# Patient Record
Sex: Female | Born: 1980 | Race: White | Hispanic: No | Marital: Married | Smoking: Never smoker
Health system: Southern US, Community
[De-identification: ages and names within clinical notes are randomized; demographics above are authoritative.]

## PROBLEM LIST (undated history)

## (undated) DIAGNOSIS — R87629 Unspecified abnormal cytological findings in specimens from vagina: Secondary | ICD-10-CM

## (undated) DIAGNOSIS — E039 Hypothyroidism, unspecified: Secondary | ICD-10-CM

## (undated) DIAGNOSIS — K589 Irritable bowel syndrome without diarrhea: Secondary | ICD-10-CM

## (undated) DIAGNOSIS — G479 Sleep disorder, unspecified: Secondary | ICD-10-CM

## (undated) HISTORY — PX: TONSILLECTOMY: SUR1361

## (undated) HISTORY — PX: COLPOSCOPY: SHX161

---

## 2007-05-19 ENCOUNTER — Inpatient Hospital Stay (HOSPITAL_COMMUNITY): Admission: AD | Admit: 2007-05-19 | Discharge: 2007-05-19 | Payer: Self-pay | Admitting: Obstetrics & Gynecology

## 2007-12-05 ENCOUNTER — Ambulatory Visit: Payer: Self-pay | Admitting: Gynecology

## 2007-12-05 ENCOUNTER — Inpatient Hospital Stay (HOSPITAL_COMMUNITY): Admission: AD | Admit: 2007-12-05 | Discharge: 2007-12-09 | Payer: Self-pay | Admitting: Obstetrics and Gynecology

## 2007-12-06 ENCOUNTER — Encounter (INDEPENDENT_AMBULATORY_CARE_PROVIDER_SITE_OTHER): Payer: Self-pay | Admitting: Obstetrics

## 2007-12-11 ENCOUNTER — Encounter (HOSPITAL_COMMUNITY): Admission: RE | Admit: 2007-12-11 | Discharge: 2008-01-10 | Payer: Self-pay | Admitting: Obstetrics & Gynecology

## 2009-10-16 ENCOUNTER — Inpatient Hospital Stay (HOSPITAL_COMMUNITY): Admission: AD | Admit: 2009-10-16 | Discharge: 2009-10-16 | Payer: Self-pay | Admitting: Obstetrics

## 2009-10-27 ENCOUNTER — Inpatient Hospital Stay (HOSPITAL_COMMUNITY): Admission: AD | Admit: 2009-10-27 | Discharge: 2009-10-29 | Payer: Self-pay | Admitting: Obstetrics & Gynecology

## 2009-11-03 ENCOUNTER — Encounter: Admission: RE | Admit: 2009-11-03 | Discharge: 2009-11-25 | Payer: Self-pay | Admitting: Obstetrics

## 2010-05-18 ENCOUNTER — Encounter: Admission: RE | Admit: 2010-05-18 | Discharge: 2010-05-18 | Payer: Self-pay | Admitting: Orthopedic Surgery

## 2011-03-17 LAB — CBC
HCT: 36.8 % (ref 36.0–46.0)
Hemoglobin: 9.7 g/dL — ABNORMAL LOW (ref 12.0–15.0)
Hemoglobin: 12.4 g/dL (ref 12.0–15.0)
MCHC: 33.6 g/dL (ref 30.0–36.0)
MCV: 86.1 fL (ref 78.0–100.0)
MCV: 86.1 fL (ref 78.0–100.0)
Platelets: 213 10*3/uL (ref 150–400)
RBC: 3.31 MIL/uL — ABNORMAL LOW (ref 3.87–5.11)
WBC: 9.5 10*3/uL (ref 4.0–10.5)

## 2011-03-17 LAB — RPR: RPR Ser Ql: NONREACTIVE

## 2011-03-17 LAB — TYPE AND SCREEN: ABO/RH(D): AB POS

## 2011-04-27 NOTE — Op Note (Signed)
NAMEKAYIA, BILLINGER              ACCOUNT NO.:  [id number]   MEDICAL RECORD NO.:  [id number]          PATIENT TYPE:  INP   LOCATION:  9199                          FACILITY:  [facility]   PHYSICIAN:  Lendon Colonel, MD   DATE OF BIRTH:  19-Dec-1980   DATE OF PROCEDURE:  12/06/2007  DATE OF DISCHARGE:                               OPERATIVE REPORT   PREOPERATIVE DIAGNOSES:  A 36-week intrauterine pregnancy, spontaneous  labor and rupture of membranes, cephalopelvic disproportion, failed  vacuum.   POSTOPERATIVE DIAGNOSES:  A 36-week intrauterine pregnancy, spontaneous  labor and rupture of membranes, cephalopelvic disproportion, failed  vacuum.   PROCEDURE:  Primary low transverse cesarean section.   SURGEON:  Lendon Colonel, MD   ASSISTANT:  Ginger Carne, MD   ANESTHESIA:  Duramorph spinal.   ESTIMATED BLOOD LOSS:  500 cc.   SPECIMENS:  None.   ANTIBIOTICS:  Ancef 2 grams.   FINDINGS:  A female infant in the LOP position.  Apgars 9 and 9.  Weight  6 pounds 9 ounces.  Normal uterus, tubes and ovaries.  Normal placenta.   INDICATIONS:  This is a 30 year old G1 at 36 weeks and 1 day, presented  in spontaneous labor with rupture of membranes.  After 3 hours of  pushing and one hour of passive descent, the patient was counseled on  the risks, benefits and alternatives of vacuum assistance vs PCS.  The  patient was made aware of risks of emergent C-section and a  cephalohematoma with vacuum.  The patient desired a vacuum.  Baby was  thought to be in the ROA position.  The vacuum had three pop-ups with  minimal descent passed the +2 station and decision was made to proceed  with primary low transverse cesarean section.   PROCEDURE:  The patient was taken to the operating room where spinal  anesthesia was found to be adequate.  She was prepped and draped in the  normal sterile fashion in the dorsal supine lithotomy position.  A Foley  catheter was inserted into the bladder.   Pfannenstiel skin incision was  made 2 cm above the pubic symphysis in the midline with the scalpel  carried through to the underlying layer of fascia with a Bovie.  The  fascia was incised in the midline and incision extended laterally.  The  _inerior aspect of the  fascial incision was grasped with Kocher clamp,  elevated up and then the rectus muscle was dissected off sharply.  The  superior aspect of the fascial incision was grasped with Kocher clamps,  elevated up and the underlying rectus muscles were dissected off  sharply.  The pyramidalis muscles were separated in the midline.  The  rectus muscles were separated in the midline.  The peritoneum was  identified and entered bluntly and the peritoneal incision was extended  superiorly and inferiorly with good visualization of the bladder.  The  bladder blade was inserted.  The vesicouterine peritoneum was grasped  with pickups and entered sharply.  The incision was extended laterally  and the bladder flap was created digitally.  The bladder  blade was  reinserted.  The lower uterine segment was incised in a transverse  fashion with the scalpel.  The incision was extended with the bandage  scissors.  Initial attempts at delivering the infant's occiput was  unsuccessful.  A hand was called from below which brought the infant  further up into the pelvis.  An Designer, television/film set was also used to help  grasp the fetal occiput and deliver the head.  The shoulders were  delivered without complication.  The baby was bulb suctioned at  incision.  Cord was clamped and cut and the infant was handed off to the  waiting pediatrician.   The placenta was expressed.  The uterus was exteriorized, cleared of all  clots and debris.  A left lower extension was noted.  This was repaired  primarily.  The uterine incision was then repaired in a running locked  fashion with 0 Vicryl.  A third layer was used in an imbricating  fashion, starting at the left  lower extension, bringing it up towards  the uterine incision, carrying it across to the right apex of the  uterine incision.  The incision was hemostatic.  The uterus was returned  to the abdomen. The gutters were cleared of all clots and debris.  The  uterine incisions were re-inspected, found to be hemostatic and the  tagged edges were released.  The peritoneum was closed with 2-0 Vicryl  in a running fashion.  The cut muscle edges on the underside of the  fascia was inspected, found to be hemostatic and the fascia was closed  with 0 Vicryl in a running fashion in a single suture.  The subcutaneous  tissue was irrigated.  No capillary bleeders were noted and the skin was  closed with staples.  The patient tolerated the procedure well.  Sponge,  lap and needle count were correct x3.  The patient was taken to the  recovery room in stable condition.      Lendon Colonel, MD  Electronically Signed     KAF/MEDQ  D:  12/06/2007  T:  12/06/2007  Job:  [id number]

## 2011-04-30 NOTE — Discharge Summary (Signed)
NAMETANGY, DROZDOWSKI              ACCOUNT NO.:  [id number]   MEDICAL RECORD NO.:  [id number]          PATIENT TYPE:  INP   LOCATION:  9143                          FACILITY:  [facility]   PHYSICIAN:  Lendon Colonel, MD   DATE OF BIRTH:  08/20/1981   DATE OF ADMISSION:  12/05/2007  DATE OF DISCHARGE:  12/09/2007                               DISCHARGE SUMMARY   CHIEF COMPLAINT:  Rupture of membranes.   HISTORY OF PRESENT ILLNESS:  This 30 year old gravida 1, at 36 weeks 0  days who presents with rupture of membranes several hours prior to  presentation.  She now reported increasing contractions every two  minutes, positive rupture of membranes, no vaginal bleeding, and active  fetal movements.  Prenatal course has been unremarkable with onset of  care at [facility] at 6 weeks.  She was on prenatal vitamins and  had no known drug allergies.  The remainder of past medical, surgical  obstetrical, and gynecologic history is as per her admission H&P.  On  admission, her physical examination was within normal limits.  Her  abdomen was gravid and nontender.  Speculum examination revealed her to  be grossly ruptured.  Cervical examination revealed her to be 4 cm  dilated, 50% effaced, with vertex in -1 position.  The fetal baseline  was in the 140's with 10-beat variability, no decelerations, and  positive accelerations.  She was contracting every 1.5 minutes of  moderate intensity.   ASSESSMENT:  Rupture at 36 weeks in labor and plan to move to labor and  delivery with plan for Pitocin augmentation if the patient did not  change over the next several hours.   HOSPITAL COURSE:  The patient progressed through the night and received  an epidural.  She was contracting every 3-4 minutes.  Her cervix had  progressed to 5 cm dilated, 100% effaced, with vertex in -1 position and  the plan was for continued expectant management.  By 12:30 MN, pt had  progressed to 8 cm dilated, 100% effaced, with  the fetal vertex in the  ROA position.  At 5:10 a.m.  The patient was fully dilated at 0 station,  pushing to +1 to +2 station over 30 minutes of pushing.  Pushing was  encouraged.  (The patient was fully dilated at 3:50 a.m. ) She started  pushing at 4:30 a.m.  By 7 a.m. the patient was still pushing to the +1  to +2 station.  She rested for one hour and at 8 a.m. she began pushing  again via a dense epidural.  Her cervix was fully dilated.  She easily  pushed to the +2 station and contractions were q.3 minutes.  A  discussion was had with the patient and her family the risks and  benefits of a primary cesarean section versus an attempted vacuum for  prolonged second stage.  The patient desired attempt at a vacuum.  After  three popoffs with the vacuum and minimal descent with each pull, the  decision was made to proceed with a primary low transverse cesarean  section.  That dictation can  be found in a separate note along with the  dictation of the vacuum.  Cesarean section ultimately produced a 6 pound  9 ounce female in the LOP position, Apgars 9 and 9 with normal uterus,  tubes, ovaries, and a normal placenta.  Postoperatively, the patient did  well.  By postoperative day #3 she was ambulating, voiding, tolerating  p.o., and had adequate pain relief with p.o. medicine.  She was  discharged to home with routine labor precautions.   DISPOSITION:  To home.   CONDITION ON DISCHARGE:  Stable.   DISCHARGE DIAGNOSIS:  Status post term primary low transverse cesarean  section for failed vacuum extraction.      Lendon Colonel, MD  Electronically Signed     KAF/MEDQ  D:  01/20/2008  T:  01/21/2008  Job:  [id number]

## 2011-09-17 LAB — CBC
Hemoglobin: 13.2
MCHC: 34.1
MCV: 89.2
MCV: 91.5
Platelets: 203
RBC: 2.91 — ABNORMAL LOW
RBC: 4.21
WBC: 10.4
WBC: 9.9

## 2011-09-30 LAB — URINE CULTURE: Colony Count: 45000

## 2011-09-30 LAB — URINE MICROSCOPIC-ADD ON

## 2011-09-30 LAB — URINALYSIS, ROUTINE W REFLEX MICROSCOPIC
Glucose, UA: NEGATIVE
Ketones, ur: 15 — AB
Nitrite: NEGATIVE
Urobilinogen, UA: 1

## 2011-09-30 LAB — HCG, QUANTITATIVE, PREGNANCY: hCG, Beta Chain, Quant, S: 165690 — ABNORMAL HIGH

## 2011-09-30 LAB — CBC
MCHC: 33.4
RBC: 4.18
WBC: 10.2

## 2011-09-30 LAB — ABO/RH: ABO/RH(D): AB POS

## 2011-12-10 ENCOUNTER — Ambulatory Visit: Payer: Self-pay | Admitting: General Practice

## 2012-06-25 IMAGING — CT CT HEAD WITHOUT CONTRAST
2 series · 16 of 30 positions shown, 20 images · non-contrast
Comparison: none

REASON FOR EXAM: headache, call report [phone]
COMMENTS:

PROCEDURE:     CT  - CT HEAD WITHOUT CONTRAST  - December 10, 2011  [DATE]
RESULT:     Comparison:  None
TECHNIQUE: Multiple axial images from the foramen magnum to the vertex were
obtained without IV contrast.

[Series 2: without · axial · non-contrast · 0.45mm/px · z∈[-124,-4]mm · 13 of 29 slices shown, 17 images]
[im 3/29  brain]
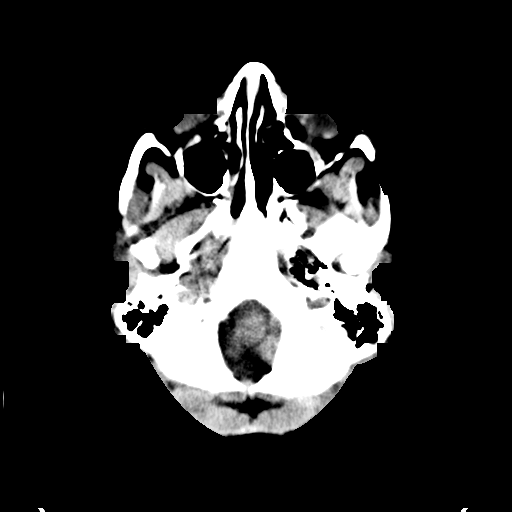
[im 3/29  bone]
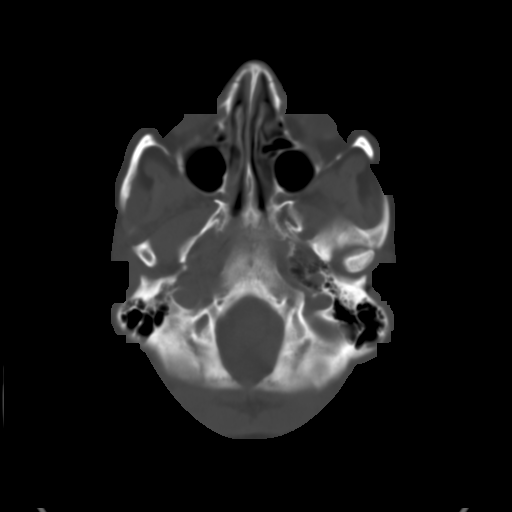
[im 5/29  brain]
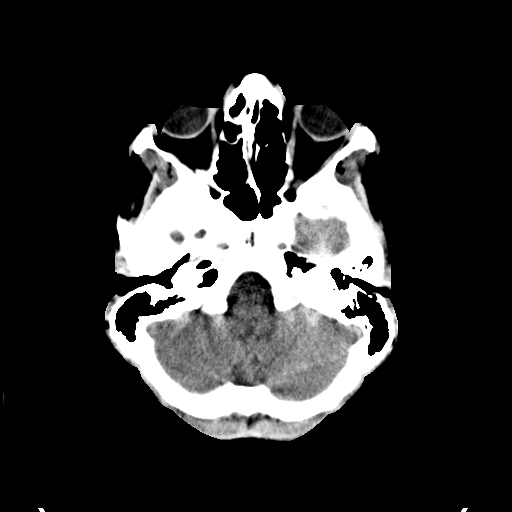
[im 7/29  brain]
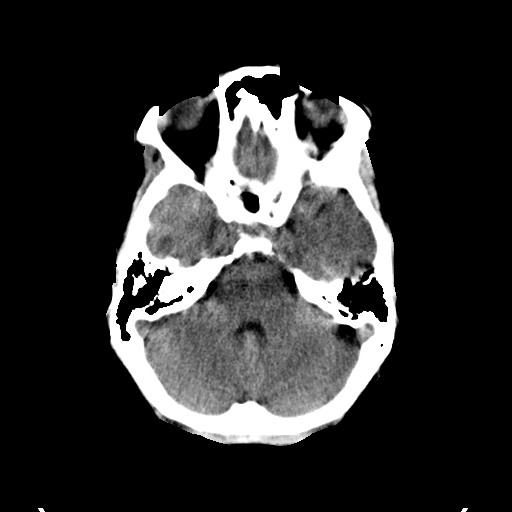
[im 9/29  brain]
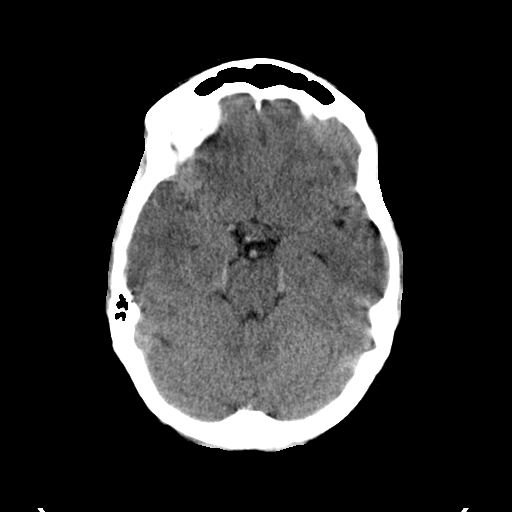
[im 11/29  brain]
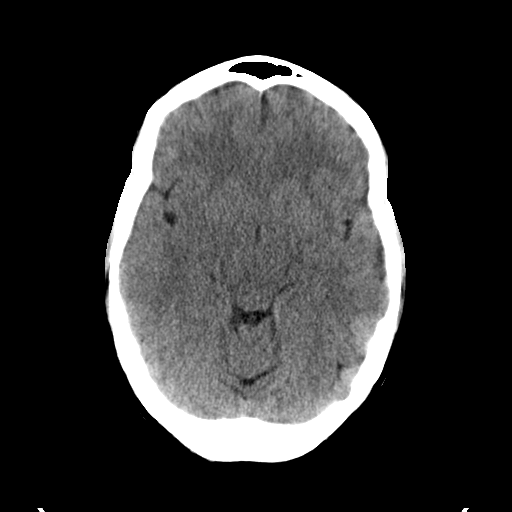
[im 11/29  bone]
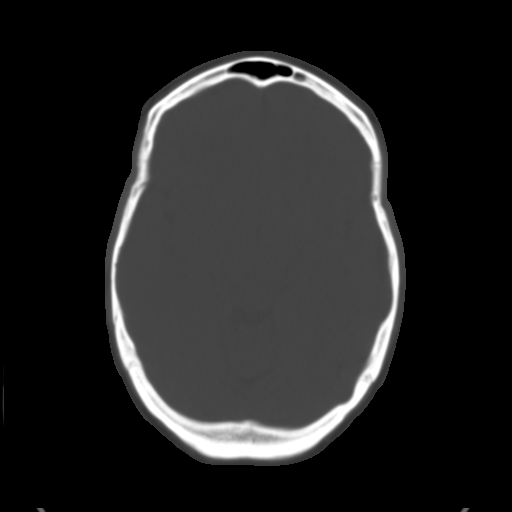
[im 13/29  brain]
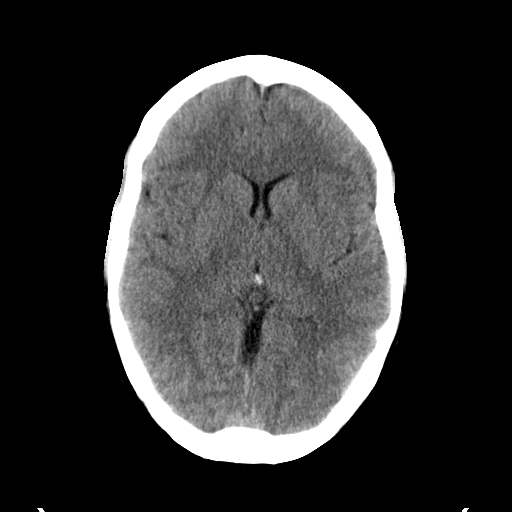
[im 15/29  brain]
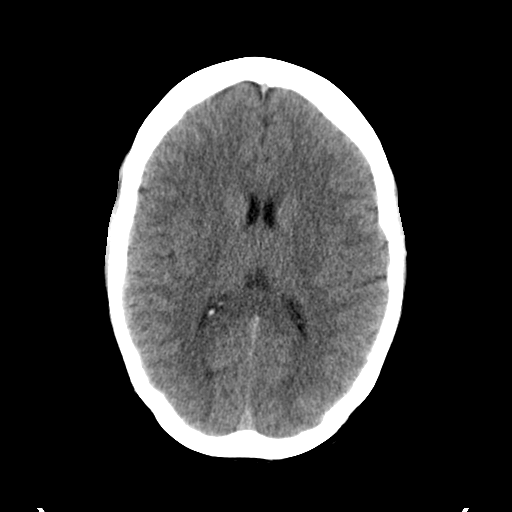
[im 17/29  brain]
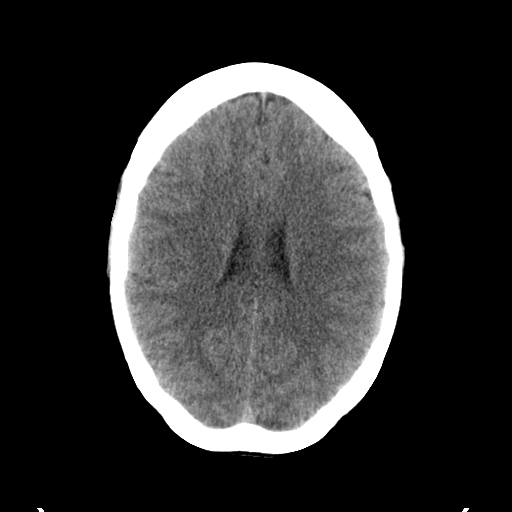
[im 19/29  brain]
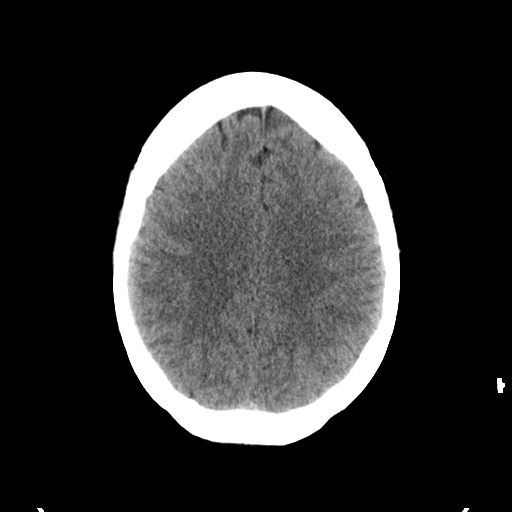
[im 19/29  bone]
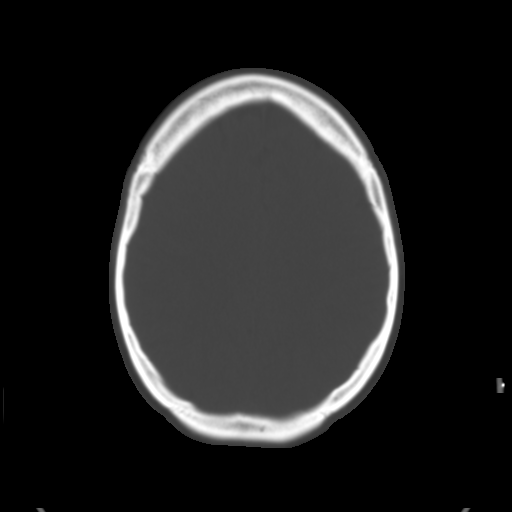
[im 21/29  brain]
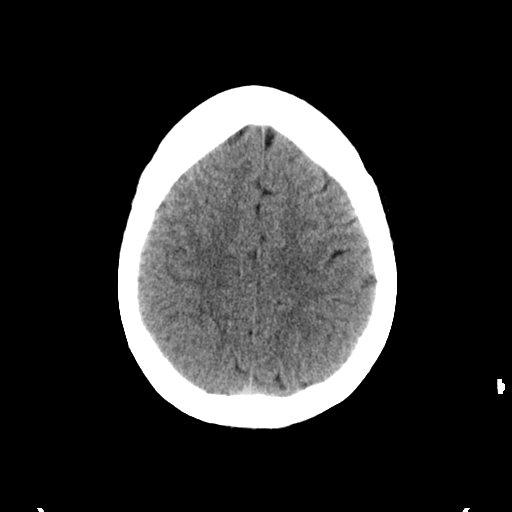
[im 23/29  brain]
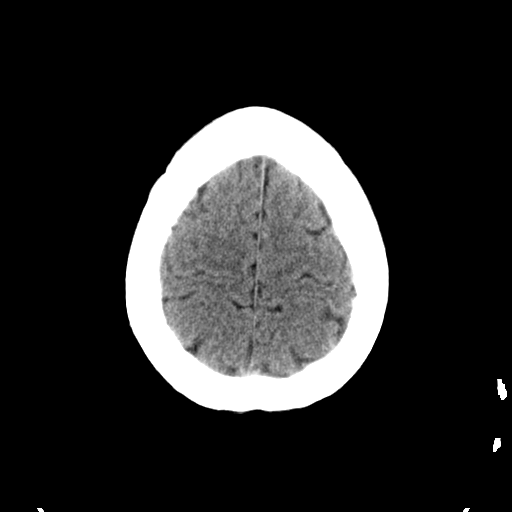
[im 25/29  brain]
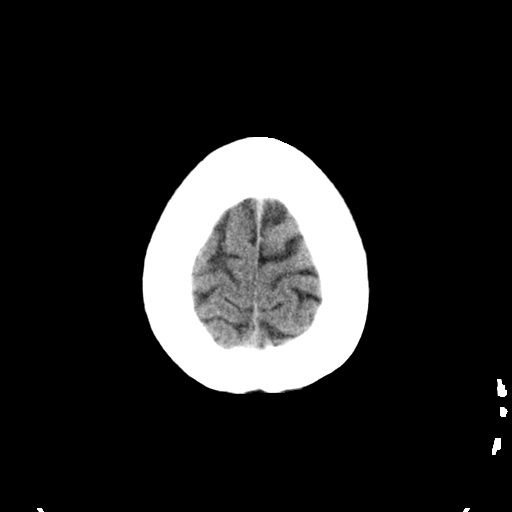
[im 27/29  brain]
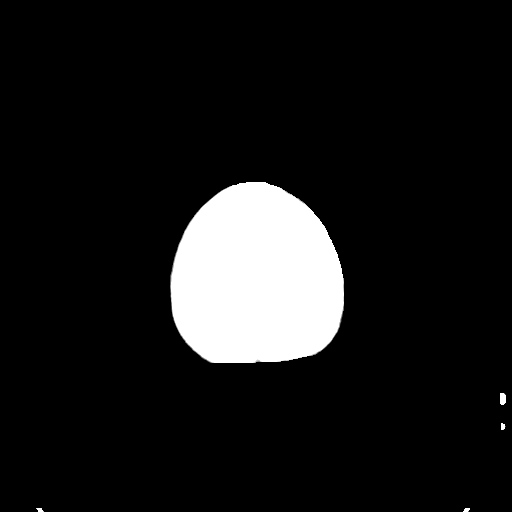
[im 27/29  bone]
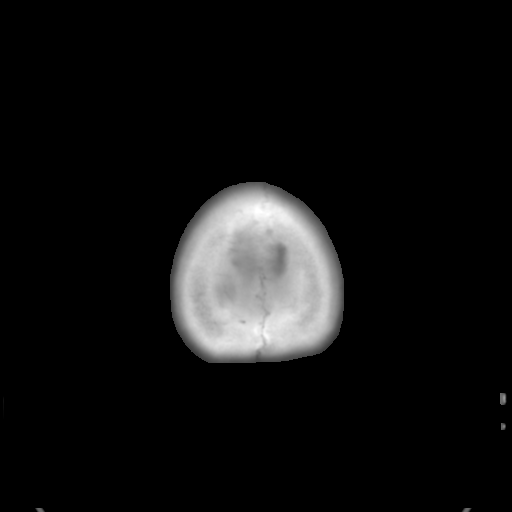

[Series 3: bone · axial · 0.45mm/px · z∈[-124,-84]mm · 3 of 29 slices shown]
[im 3/29  bone]
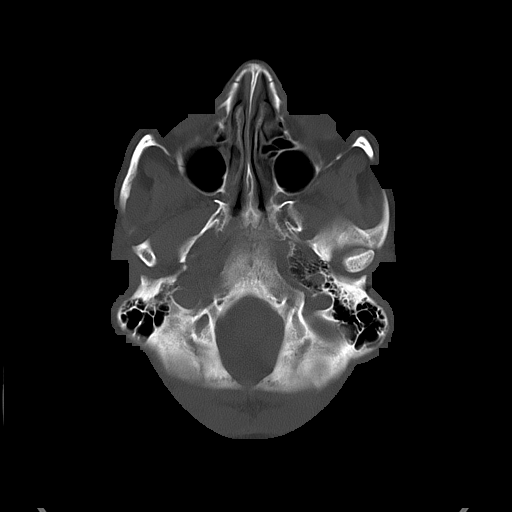
[im 7/29  bone]
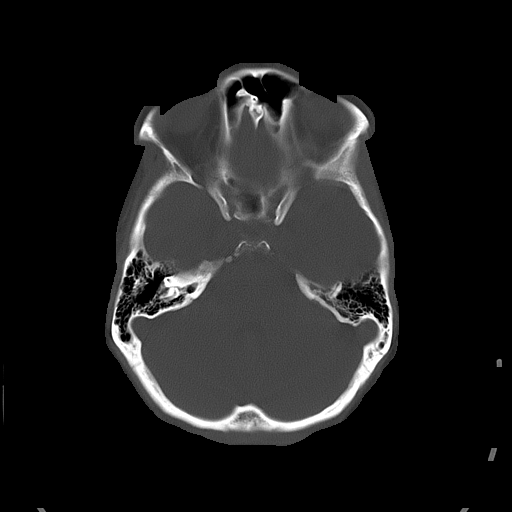
[im 11/29  bone]
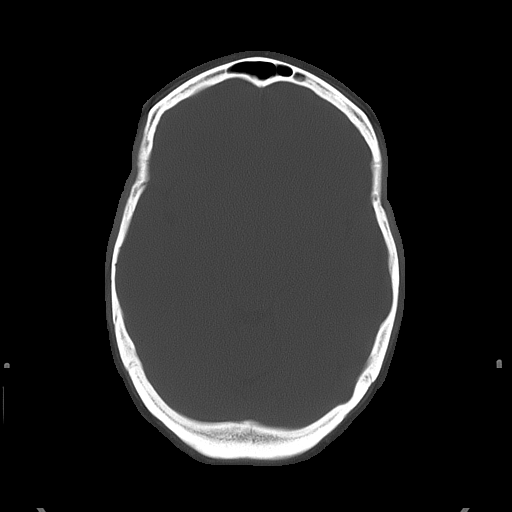

[16 of 30 positions shown; findings below may reference images not displayed]

FINDINGS: There is no evidence of mass effect, midline shift, or extra-axial fluid
collections.  There is no evidence of a space-occupying lesion or
intracranial hemorrhage. There is no evidence of a cortical-based area of
acute infarction.

The ventricles and sulci are appropriate for the patient's age. The basal
cisterns are patent.

Visualized portions of the orbits are unremarkable. The visualized portions
of the paranasal sinuses and mastoid air cells are unremarkable.

The osseous structures are unremarkable.
IMPRESSION: No acute intracranial process.

## 2015-03-25 ENCOUNTER — Other Ambulatory Visit (HOSPITAL_COMMUNITY): Payer: Self-pay | Admitting: Otolaryngology

## 2015-03-25 DIAGNOSIS — E049 Nontoxic goiter, unspecified: Secondary | ICD-10-CM

## 2015-03-31 ENCOUNTER — Ambulatory Visit (HOSPITAL_COMMUNITY)
Admission: RE | Admit: 2015-03-31 | Discharge: 2015-03-31 | Disposition: A | Discharge status: Home / Self Care | Payer: BLUE CROSS/BLUE SHIELD | Source: Ambulatory Visit | Attending: Otolaryngology | Admitting: Otolaryngology

## 2015-03-31 DIAGNOSIS — E049 Nontoxic goiter, unspecified: Secondary | ICD-10-CM | POA: Diagnosis not present

## 2015-10-15 IMAGING — US US SOFT TISSUE HEAD/NECK
1 series · 14 of 25 positions shown · non-contrast
Comparison: None.

CLINICAL DATA: Enlargement of the left side of the thyroid gland by
physical exam.

EXAM:
THYROID ULTRASOUND
TECHNIQUE: Ultrasound examination of the thyroid gland and adjacent soft
tissues was performed.

[Series 1: us soft tissue head/neck · 0.05mm/px · 14 of 52 slices shown]
[im 1/52]
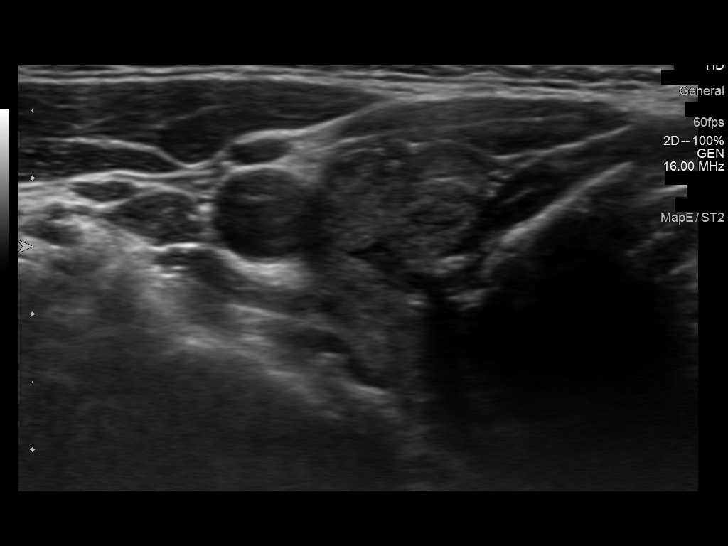
[im 5/52]
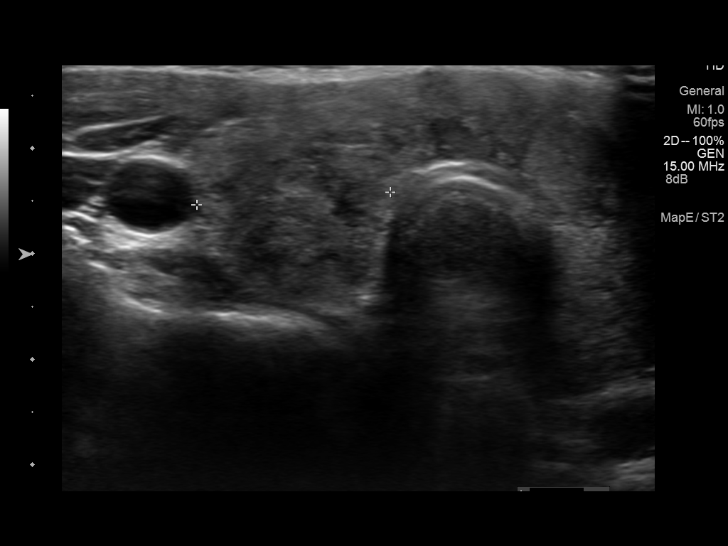
[im 9/52]
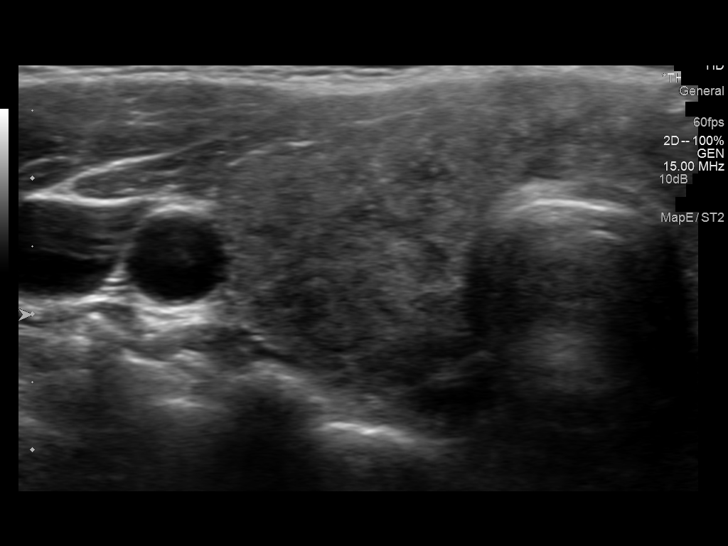
[im 13/52]
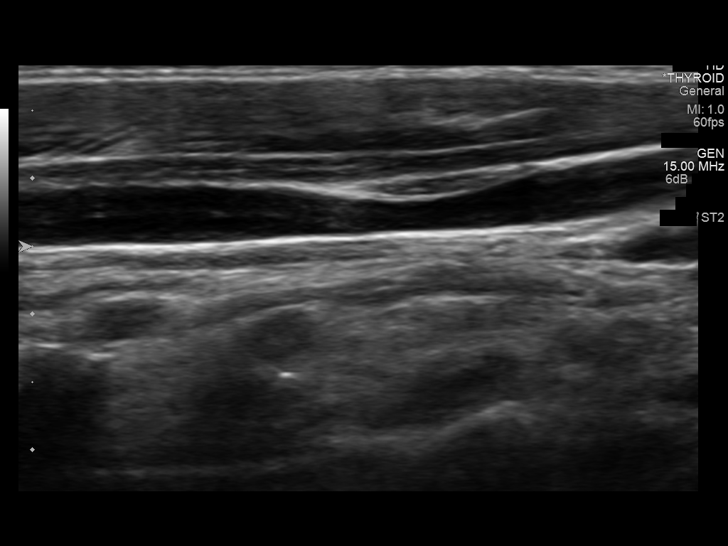
[im 18/52]
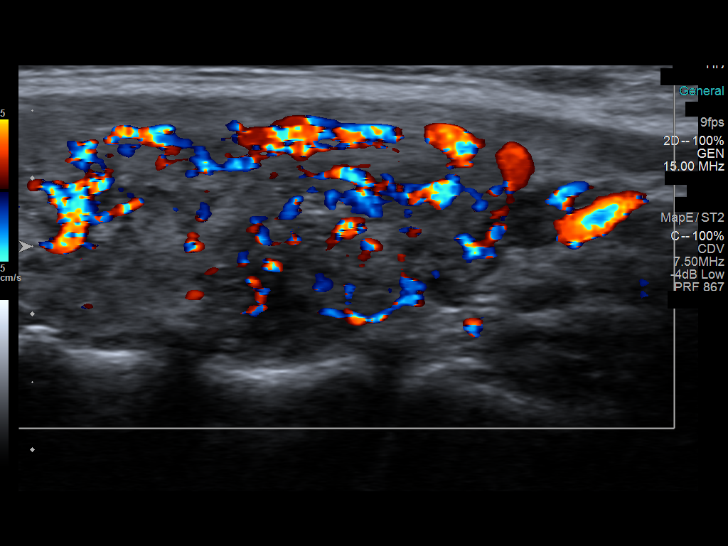
[im 20/52]
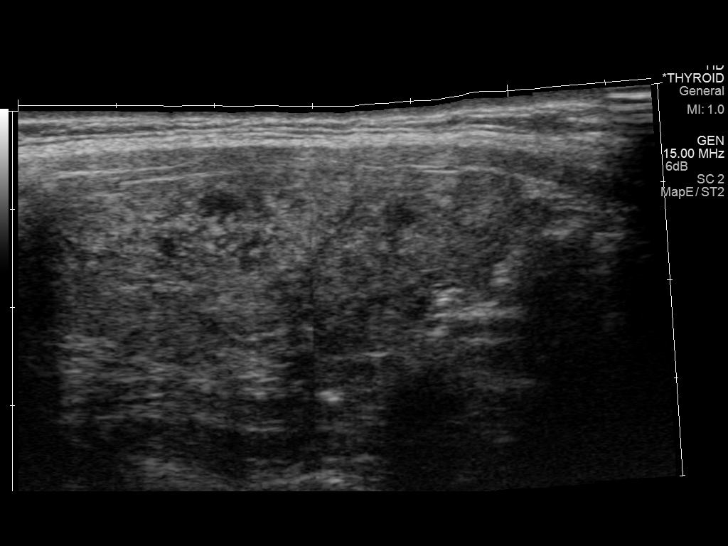
[im 24/52]
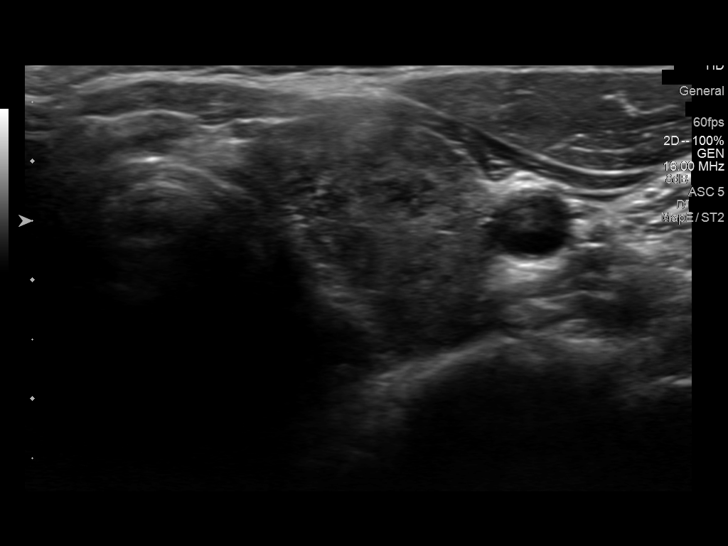
[im 28/52]
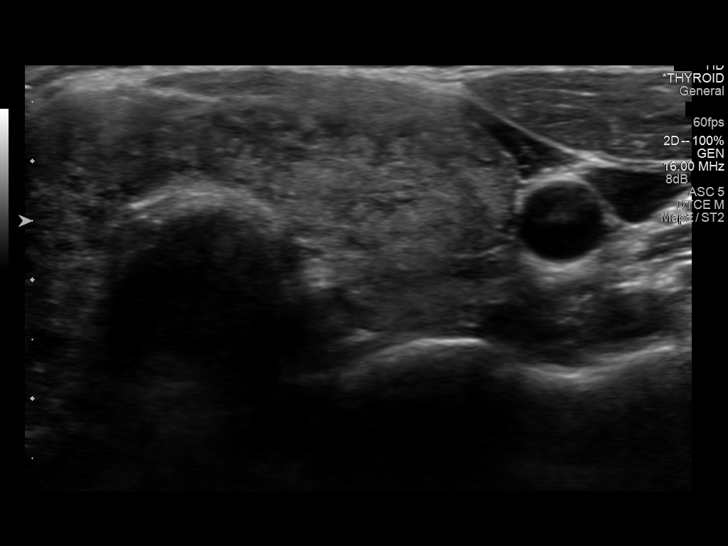
[im 32/52]
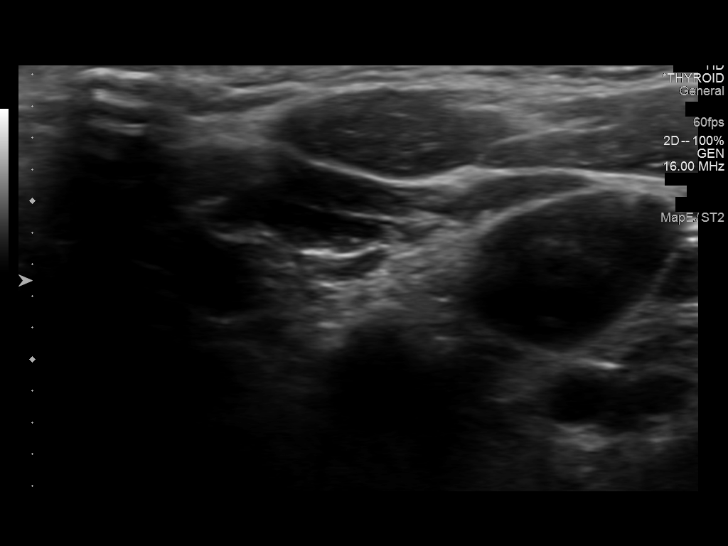
[im 35/52]
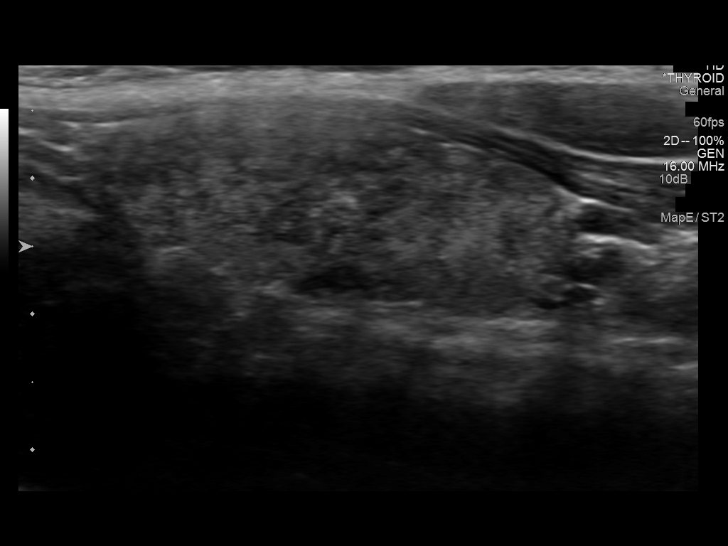
[im 39/52]
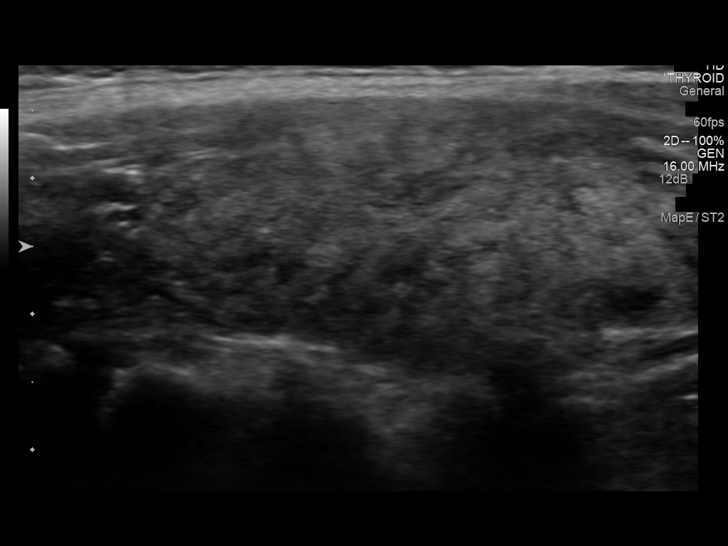
[im 43/52]
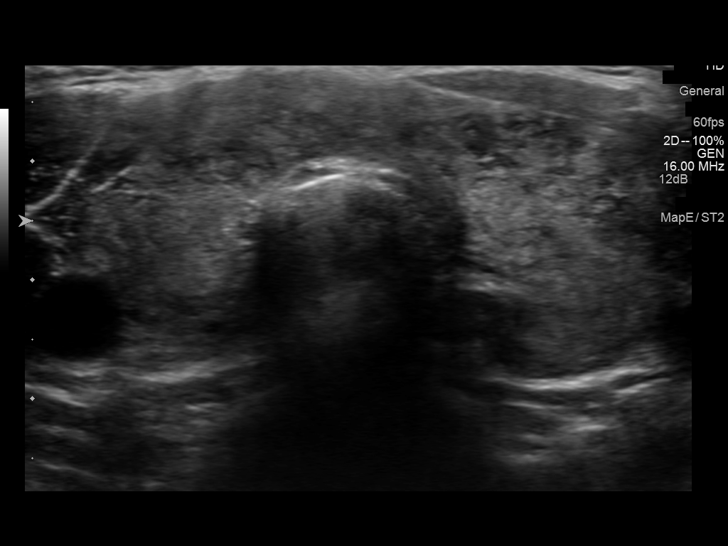
[im 47/52]
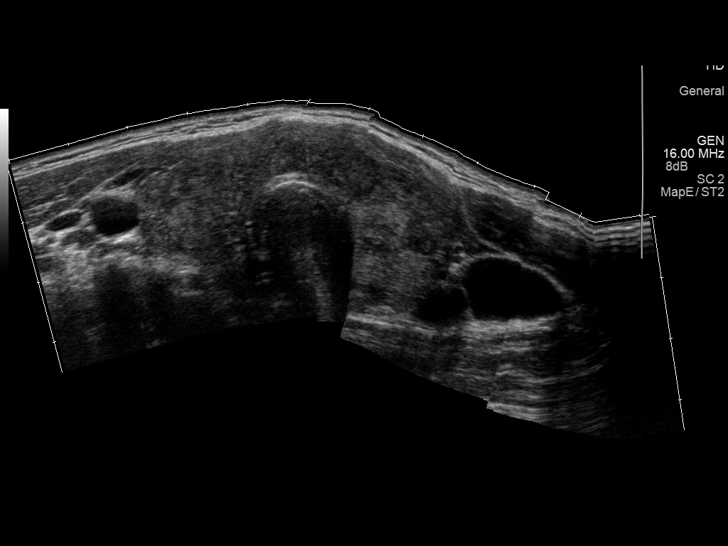
[im 52/52]
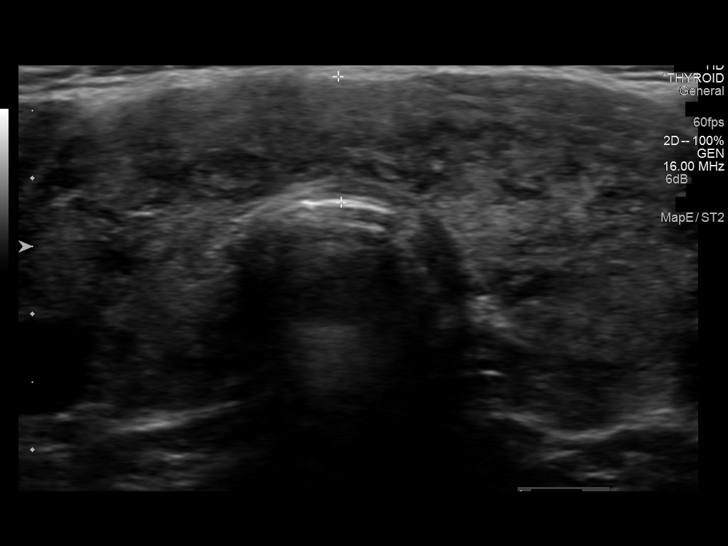

[14 of 25 positions shown; findings below may reference images not displayed]

FINDINGS: Right thyroid lobe

Measurements: 5.6 x 2.2 x 1.8 cm. Heterogeneous gland without focal
nodule.

Left thyroid lobe

Measurements: 5.0 x 1.4 x 2.1 cm. Heterogeneous gland without focal
nodule

Isthmus

Thickness: 9 mm.  No nodules visualized.

Lymphadenopathy

None visualized.
IMPRESSION: There is borderline enlargement of the gland. It is heterogeneous
without focal nodule.

## 2017-06-13 LAB — OB RESULTS CONSOLE GC/CHLAMYDIA
Chlamydia: NEGATIVE
Gonorrhea: NEGATIVE

## 2017-06-13 LAB — OB RESULTS CONSOLE ANTIBODY SCREEN: ANTIBODY SCREEN: NEGATIVE

## 2017-06-13 LAB — OB RESULTS CONSOLE ABO/RH: RH TYPE: POSITIVE

## 2017-06-13 LAB — OB RESULTS CONSOLE HEPATITIS B SURFACE ANTIGEN: HEP B S AG: NEGATIVE

## 2017-06-13 LAB — OB RESULTS CONSOLE HIV ANTIBODY (ROUTINE TESTING): HIV: NONREACTIVE

## 2017-06-13 LAB — OB RESULTS CONSOLE RPR: RPR: NONREACTIVE

## 2017-06-13 LAB — OB RESULTS CONSOLE RUBELLA ANTIBODY, IGM: Rubella: IMMUNE

## 2017-10-13 LAB — OB RESULTS CONSOLE GBS: STREP GROUP B AG: POSITIVE

## 2017-12-14 ENCOUNTER — Encounter (HOSPITAL_COMMUNITY): Payer: Self-pay | Admitting: *Deleted

## 2017-12-15 ENCOUNTER — Other Ambulatory Visit: Payer: Self-pay | Admitting: Obstetrics

## 2017-12-15 ENCOUNTER — Telehealth (HOSPITAL_COMMUNITY): Payer: Self-pay | Admitting: *Deleted

## 2017-12-15 NOTE — Telephone Encounter (Signed)
Preadmission screen  

## 2017-12-20 ENCOUNTER — Encounter (HOSPITAL_COMMUNITY): Payer: Self-pay

## 2017-12-27 ENCOUNTER — Encounter (HOSPITAL_COMMUNITY)
Admission: RE | Admit: 2017-12-27 | Discharge: 2017-12-27 | Disposition: A | Discharge status: Home / Self Care | Payer: BLUE CROSS/BLUE SHIELD | Source: Ambulatory Visit | Attending: Obstetrics | Admitting: Obstetrics

## 2017-12-27 HISTORY — DX: Unspecified abnormal cytological findings in specimens from vagina: R87.629

## 2017-12-27 HISTORY — DX: Irritable bowel syndrome without diarrhea: K58.9

## 2017-12-27 HISTORY — DX: Irritable bowel syndrome, unspecified: K58.9

## 2017-12-27 HISTORY — DX: Sleep disorder, unspecified: G47.9

## 2017-12-27 HISTORY — DX: Hypothyroidism, unspecified: E03.9

## 2017-12-27 LAB — CBC
HEMATOCRIT: 35 % — AB (ref 36.0–46.0)
HEMOGLOBIN: 12.1 g/dL (ref 12.0–15.0)
MCH: 30.6 pg (ref 26.0–34.0)
MCHC: 34.6 g/dL (ref 30.0–36.0)
MCV: 88.6 fL (ref 78.0–100.0)
Platelets: 206 10*3/uL (ref 150–400)
RBC: 3.95 MIL/uL (ref 3.87–5.11)
RDW: 13.3 % (ref 11.5–15.5)
WBC: 7.6 10*3/uL (ref 4.0–10.5)

## 2017-12-27 LAB — TYPE AND SCREEN
ABO/RH(D): AB POS
ANTIBODY SCREEN: NEGATIVE

## 2017-12-27 NOTE — Patient Instructions (Signed)
Lori Davis  12/27/2017   Your procedure is scheduled on:  12/28/2017  Enter through the Main Entrance of [facility] at 0630 AM.  Pick up the phone at the desk and dial [phone]  Call this number if you have problems the morning of surgery:[phone]  Remember:   Do not eat food:After Midnight.  Do not drink clear liquids: After Midnight.  Take these medicines the morning of surgery with A SIP OF WATER: none   Do not wear jewelry, make-up or nail polish.  Do not wear lotions, powders, or perfumes. Do not wear deodorant.  Do not shave 48 hours prior to surgery.  Do not bring valuables to the hospital.  [facility] is not   responsible for any belongings or valuables brought to the hospital.  Contacts, dentures or bridgework may not be worn into surgery.  Leave suitcase in the car. After surgery it may be brought to your room.  For patients admitted to the hospital, checkout time is 11:00 AM the day of              discharge.    N/A   Please read over the following fact sheets that you were given:   Surgical Site Infection Prevention

## 2017-12-27 NOTE — H&P (Signed)
Lori Davis is a 37 y.o. G3P1002 at 5236w0d presenting for elective RCS. Pt notes intermittent contractions. Good fetal movement, No vaginal bleeding, not leaking fluid.  [facility] at [facility] since 6 wks - Prior c/s x 2, plans 3rd repeat - GBS bacteruria, persistant - AMA, nl Informaseq   Prenatal Transfer Tool  Maternal Diabetes: No Genetic Screening: Normal Maternal Ultrasounds/Referrals: Normal Fetal Ultrasounds or other Referrals:  None Maternal Substance Abuse:  No Significant Maternal Medications:  None Significant Maternal Lab Results: None     OB History    Gravida Para Term Preterm AB Living   3 1 1     2    SAB TAB Ectopic Multiple Live Births           2     Past Medical History:  Diagnosis Date  . Hypothyroidism   . IBS (irritable bowel syndrome)   . Sleep disorder   . Vaginal Pap smear, abnormal    Past Surgical History:  Procedure Laterality Date  . CESAREAN SECTION    . COLPOSCOPY    . TONSILLECTOMY     Family History: family history includes Breast cancer in her maternal grandmother; Heart disease in her paternal grandmother; Hypertension in her father, maternal grandmother, mother, and paternal grandmother; Migraines in her mother; Pancreatic cancer in her paternal grandfather. Social History:  reports that  has never smoked. she has never used smokeless tobacco. She reports that she does not drink alcohol or use drugs.  Review of Systems - Negative except discomfort of pregnancy  Vitals:   12/28/17 0711 12/28/17 0737  BP: 115/67   Pulse: 63   Temp: 98.5 F (36.9 C)   TempSrc: Oral   Weight:  78.5 kg (173 lb)  Height: 5\' 2"  (1.575 m)        Physical Exam:  Gen: well appearing, no distress  Back: no CVAT Abd: gravid, NT, no RUQ pain LE: no edema, equal bilaterally, non-tender   Prenatal labs: ABO, Rh: --/--/AB POS (01/15 1045) Antibody: NEG (01/15 1045) Rubella: Immune (07/02 0000) RPR: Nonreactive (07/02 0000)  HBsAg:  Negative (07/02 0000)  HIV: Non-reactive (07/02 0000)  GBS: Positive (11/01 0000)  1 hr Glucola 114  Genetic screening Nl INformaseq, nl AFP Anatomy US normal   CBC    Component Value Date/Time   WBC 7.6 12/27/2017 1045   RBC 3.95 12/27/2017 1045   HGB 12.1 12/27/2017 1045   HCT 35.0 (L) 12/27/2017 1045   PLT 206 12/27/2017 1045   MCV 88.6 12/27/2017 1045   MCH 30.6 12/27/2017 1045   MCHC 34.6 12/27/2017 1045   RDW 13.3 12/27/2017 1045    Assessment/Plan: 37 y.o. G3P1002 at 5136w0d - RCS. R/B reviewed with pt - GBS bacteruria   Lendon ColonelKelly A Quaneisha Hanisch 12/27/2017, 7:51 PM

## 2017-12-27 NOTE — Anesthesia Preprocedure Evaluation (Addendum)
Anesthesia Evaluation  Patient identified by MRN, date of birth, ID band Patient awake    Reviewed: Allergy & Precautions, NPO status , Patient's Chart, lab work & pertinent test results  History of Anesthesia Complications Negative for: history of anesthetic complications  Airway Mallampati: II  TM Distance: >3 FB Neck ROM: Full    Dental no notable dental hx. (+) Dental Advisory Given   Pulmonary neg pulmonary ROS,    Pulmonary exam normal        Cardiovascular negative cardio ROS Normal cardiovascular exam     Neuro/Psych negative neurological ROS  negative psych ROS   GI/Hepatic Neg liver ROS, IBS   Endo/Other    Renal/GU negative Renal ROS  negative genitourinary   Musculoskeletal negative musculoskeletal ROS (+)   Abdominal   Peds negative pediatric ROS (+)  Hematology negative hematology ROS (+)   Anesthesia Other Findings   Reproductive/Obstetrics negative OB ROS                            Anesthesia Physical Anesthesia Plan  ASA: II  Anesthesia Plan: MAC and Spinal   Post-op Pain Management:    Induction:   PONV Risk Score and Plan: 3 and Ondansetron, Dexamethasone and Scopolamine patch - Pre-op  Airway Management Planned: Natural Airway and Simple Face Mask  Additional Equipment:   Intra-op Plan:   Post-operative Plan:   Informed Consent: I have reviewed the patients History and Physical, chart, labs and discussed the procedure including the risks, benefits and alternatives for the proposed anesthesia with the patient or authorized representative who has indicated his/her understanding and acceptance.   Dental advisory given  Plan Discussed with: Anesthesiologist  Anesthesia Plan Comments:        Anesthesia Quick Evaluation

## 2017-12-28 ENCOUNTER — Inpatient Hospital Stay (HOSPITAL_COMMUNITY): Payer: BLUE CROSS/BLUE SHIELD | Admitting: Anesthesiology

## 2017-12-28 ENCOUNTER — Encounter (HOSPITAL_COMMUNITY): Payer: Self-pay | Admitting: *Deleted

## 2017-12-28 ENCOUNTER — Encounter (HOSPITAL_COMMUNITY)
Admission: AD | Disposition: A | Discharge status: Home / Self Care | Payer: Self-pay | Source: Ambulatory Visit | Attending: Obstetrics

## 2017-12-28 ENCOUNTER — Inpatient Hospital Stay (HOSPITAL_COMMUNITY)
Admission: AD | Admit: 2017-12-28 | Discharge: 2017-12-31 | DRG: 788 | Disposition: A | Discharge status: Home / Self Care | Payer: BLUE CROSS/BLUE SHIELD | Source: Ambulatory Visit | Attending: Obstetrics | Admitting: Obstetrics

## 2017-12-28 DIAGNOSIS — O34211 Maternal care for low transverse scar from previous cesarean delivery: Secondary | ICD-10-CM | POA: Diagnosis present

## 2017-12-28 DIAGNOSIS — Z3A39 39 weeks gestation of pregnancy: Secondary | ICD-10-CM

## 2017-12-28 DIAGNOSIS — Z98891 History of uterine scar from previous surgery: Secondary | ICD-10-CM

## 2017-12-28 DIAGNOSIS — O99824 Streptococcus B carrier state complicating childbirth: Secondary | ICD-10-CM | POA: Diagnosis present

## 2017-12-28 LAB — RPR: RPR Ser Ql: NONREACTIVE

## 2017-12-28 SURGERY — Surgical Case
Anesthesia: Monitor Anesthesia Care

## 2017-12-28 MED ORDER — LACTATED RINGERS IV SOLN
INTRAVENOUS | Status: DC | PRN
  Administered 2017-12-28: 09:00:00 via INTRAVENOUS

## 2017-12-28 MED ORDER — NALBUPHINE HCL 10 MG/ML IJ SOLN: 5.0000 mg | INTRAMUSCULAR | Status: DC | PRN

## 2017-12-28 MED ORDER — SCOPOLAMINE 1 MG/3DAYS TD PT72
MEDICATED_PATCH | TRANSDERMAL | Status: AC
  Filled 2017-12-28: qty 1

## 2017-12-28 MED ORDER — FENTANYL CITRATE (PF) 100 MCG/2ML IJ SOLN
INTRAMUSCULAR | Status: DC | PRN
  Administered 2017-12-28: 10 ug via INTRATHECAL

## 2017-12-28 MED ORDER — PROMETHAZINE HCL 25 MG/ML IJ SOLN: 6.2500 mg | INTRAMUSCULAR | Status: DC | PRN

## 2017-12-28 MED ORDER — SIMETHICONE 80 MG PO CHEW
80.0000 mg | CHEWABLE_TABLET | Freq: Three times a day (TID) | ORAL | Status: DC
  Administered 2017-12-29 – 2017-12-31 (×5): 80 mg via ORAL
  Filled 2017-12-28 (×6): qty 1

## 2017-12-28 MED ORDER — FENTANYL CITRATE (PF) 100 MCG/2ML IJ SOLN
INTRAMUSCULAR | Status: AC
  Filled 2017-12-28: qty 2

## 2017-12-28 MED ORDER — NALBUPHINE HCL 10 MG/ML IJ SOLN
5.0000 mg | Freq: Once | INTRAMUSCULAR | Status: AC | PRN
  Administered 2017-12-28: 5 mg via INTRAVENOUS

## 2017-12-28 MED ORDER — LACTATED RINGERS IV SOLN
INTRAVENOUS | Status: DC
  Administered 2017-12-28: 20:00:00 via INTRAVENOUS

## 2017-12-28 MED ORDER — METOCLOPRAMIDE HCL 5 MG/ML IJ SOLN
INTRAMUSCULAR | Status: AC
  Filled 2017-12-28: qty 2

## 2017-12-28 MED ORDER — DIPHENHYDRAMINE HCL 25 MG PO CAPS: 25.0000 mg | ORAL_CAPSULE | Freq: Four times a day (QID) | ORAL | Status: DC | PRN

## 2017-12-28 MED ORDER — ACETAMINOPHEN 325 MG PO TABS
650.0000 mg | ORAL_TABLET | ORAL | Status: DC | PRN
  Administered 2017-12-28 – 2017-12-31 (×6): 650 mg via ORAL
  Filled 2017-12-28 (×6): qty 2

## 2017-12-28 MED ORDER — DIPHENHYDRAMINE HCL 50 MG/ML IJ SOLN: 12.5000 mg | INTRAMUSCULAR | Status: DC | PRN

## 2017-12-28 MED ORDER — COCONUT OIL OIL
1.0000 "application " | TOPICAL_OIL | Status: DC | PRN
  Administered 2017-12-29: 1 via TOPICAL
  Filled 2017-12-28: qty 120

## 2017-12-28 MED ORDER — LACTATED RINGERS IV SOLN
INTRAVENOUS | Status: DC
  Administered 2017-12-28 (×2): via INTRAVENOUS

## 2017-12-28 MED ORDER — OXYTOCIN 10 UNIT/ML IJ SOLN
INTRAVENOUS | Status: DC | PRN
  Administered 2017-12-28: 40 [IU] via INTRAVENOUS

## 2017-12-28 MED ORDER — PRENATAL MULTIVITAMIN CH
1.0000 | ORAL_TABLET | Freq: Every day | ORAL | Status: DC
  Administered 2017-12-29 – 2017-12-31 (×3): 1 via ORAL
  Filled 2017-12-28 (×3): qty 1

## 2017-12-28 MED ORDER — BUPIVACAINE IN DEXTROSE 0.75-8.25 % IT SOLN
INTRATHECAL | Status: DC | PRN
  Administered 2017-12-28: 12 mL via INTRATHECAL

## 2017-12-28 MED ORDER — SODIUM CHLORIDE 0.9 % IR SOLN
Status: DC | PRN
  Administered 2017-12-28: 300 mL

## 2017-12-28 MED ORDER — ONDANSETRON HCL 4 MG/2ML IJ SOLN: 4.0000 mg | Freq: Three times a day (TID) | INTRAMUSCULAR | Status: DC | PRN

## 2017-12-28 MED ORDER — DIPHENHYDRAMINE HCL 25 MG PO CAPS
25.0000 mg | ORAL_CAPSULE | ORAL | Status: DC | PRN
  Filled 2017-12-28: qty 1

## 2017-12-28 MED ORDER — SIMETHICONE 80 MG PO CHEW
80.0000 mg | CHEWABLE_TABLET | ORAL | Status: DC
  Administered 2017-12-28 – 2017-12-30 (×3): 80 mg via ORAL
  Filled 2017-12-28 (×3): qty 1

## 2017-12-28 MED ORDER — SODIUM CHLORIDE 0.9% FLUSH: 3.0000 mL | INTRAVENOUS | Status: DC | PRN

## 2017-12-28 MED ORDER — PHENYLEPHRINE 8 MG IN D5W 100 ML (0.08MG/ML) PREMIX OPTIME
INJECTION | INTRAVENOUS | Status: AC
  Filled 2017-12-28: qty 100

## 2017-12-28 MED ORDER — BUPIVACAINE IN DEXTROSE 0.75-8.25 % IT SOLN
INTRATHECAL | Status: AC
  Filled 2017-12-28: qty 2

## 2017-12-28 MED ORDER — IBUPROFEN 600 MG PO TABS
600.0000 mg | ORAL_TABLET | Freq: Four times a day (QID) | ORAL | Status: DC
  Administered 2017-12-28 – 2017-12-31 (×12): 600 mg via ORAL
  Filled 2017-12-28 (×12): qty 1

## 2017-12-28 MED ORDER — MENTHOL 3 MG MT LOZG: 1.0000 | LOZENGE | OROMUCOSAL | Status: DC | PRN

## 2017-12-28 MED ORDER — METOCLOPRAMIDE HCL 5 MG/ML IJ SOLN
INTRAMUSCULAR | Status: DC | PRN
  Administered 2017-12-28: 10 mg via INTRAVENOUS

## 2017-12-28 MED ORDER — DEXAMETHASONE SODIUM PHOSPHATE 4 MG/ML IJ SOLN
INTRAMUSCULAR | Status: DC | PRN
  Administered 2017-12-28: 4 mg via INTRAVENOUS

## 2017-12-28 MED ORDER — OXYTOCIN 10 UNIT/ML IJ SOLN
INTRAMUSCULAR | Status: AC
  Filled 2017-12-28: qty 4

## 2017-12-28 MED ORDER — MORPHINE SULFATE (PF) 0.5 MG/ML IJ SOLN
INTRAMUSCULAR | Status: AC
  Filled 2017-12-28: qty 10

## 2017-12-28 MED ORDER — OXYCODONE HCL 5 MG PO TABS
5.0000 mg | ORAL_TABLET | Freq: Four times a day (QID) | ORAL | Status: DC | PRN
  Administered 2017-12-28 – 2017-12-31 (×10): 5 mg via ORAL
  Filled 2017-12-28 (×10): qty 1

## 2017-12-28 MED ORDER — SCOPOLAMINE 1 MG/3DAYS TD PT72
1.0000 | MEDICATED_PATCH | Freq: Once | TRANSDERMAL | Status: AC
  Administered 2017-12-28: 1.5 mg via TRANSDERMAL

## 2017-12-28 MED ORDER — OXYTOCIN 40 UNITS IN LACTATED RINGERS INFUSION - SIMPLE MED: 2.5000 [IU]/h | INTRAVENOUS | Status: AC

## 2017-12-28 MED ORDER — PHENYLEPHRINE 40 MCG/ML (10ML) SYRINGE FOR IV PUSH (FOR BLOOD PRESSURE SUPPORT)
PREFILLED_SYRINGE | INTRAVENOUS | Status: AC
  Filled 2017-12-28: qty 10

## 2017-12-28 MED ORDER — PHENYLEPHRINE 8 MG IN D5W 100 ML (0.08MG/ML) PREMIX OPTIME
INJECTION | INTRAVENOUS | Status: DC | PRN
  Administered 2017-12-28: 60 ug/min via INTRAVENOUS

## 2017-12-28 MED ORDER — DEXAMETHASONE SODIUM PHOSPHATE 4 MG/ML IJ SOLN
INTRAMUSCULAR | Status: AC
  Filled 2017-12-28: qty 1

## 2017-12-28 MED ORDER — DIBUCAINE 1 % RE OINT: 1.0000 "application " | TOPICAL_OINTMENT | RECTAL | Status: DC | PRN

## 2017-12-28 MED ORDER — HYDROMORPHONE HCL 1 MG/ML IJ SOLN
0.2500 mg | INTRAMUSCULAR | Status: DC | PRN
  Administered 2017-12-28: 0.5 mg via INTRAVENOUS
  Administered 2017-12-28: 0.25 mg via INTRAVENOUS

## 2017-12-28 MED ORDER — NALOXONE HCL 4 MG/10ML IJ SOLN: 1.0000 ug/kg/h | INTRAVENOUS | Status: DC | PRN

## 2017-12-28 MED ORDER — WITCH HAZEL-GLYCERIN EX PADS: 1.0000 "application " | MEDICATED_PAD | CUTANEOUS | Status: DC | PRN

## 2017-12-28 MED ORDER — NALBUPHINE HCL 10 MG/ML IJ SOLN
INTRAMUSCULAR | Status: AC
  Filled 2017-12-28: qty 1

## 2017-12-28 MED ORDER — TETANUS-DIPHTH-ACELL PERTUSSIS 5-2.5-18.5 LF-MCG/0.5 IM SUSP: 0.5000 mL | Freq: Once | INTRAMUSCULAR | Status: DC

## 2017-12-28 MED ORDER — ONDANSETRON HCL 4 MG/2ML IJ SOLN
INTRAMUSCULAR | Status: DC | PRN
  Administered 2017-12-28: 4 mg via INTRAVENOUS

## 2017-12-28 MED ORDER — CEFAZOLIN SODIUM-DEXTROSE 2-4 GM/100ML-% IV SOLN
2.0000 g | INTRAVENOUS | Status: AC
  Administered 2017-12-28: 2 g via INTRAVENOUS
  Filled 2017-12-28: qty 100

## 2017-12-28 MED ORDER — HYDROMORPHONE HCL 1 MG/ML IJ SOLN
INTRAMUSCULAR | Status: AC
  Filled 2017-12-28: qty 1

## 2017-12-28 MED ORDER — NALOXONE HCL 0.4 MG/ML IJ SOLN: 0.4000 mg | INTRAMUSCULAR | Status: DC | PRN

## 2017-12-28 MED ORDER — SENNOSIDES-DOCUSATE SODIUM 8.6-50 MG PO TABS
2.0000 | ORAL_TABLET | ORAL | Status: DC
  Administered 2017-12-28 – 2017-12-30 (×3): 2 via ORAL
  Filled 2017-12-28 (×3): qty 2

## 2017-12-28 MED ORDER — MORPHINE SULFATE (PF) 0.5 MG/ML IJ SOLN
INTRAMUSCULAR | Status: DC | PRN
  Administered 2017-12-28: .2 ug via INTRATHECAL

## 2017-12-28 MED ORDER — ONDANSETRON HCL 4 MG/2ML IJ SOLN
INTRAMUSCULAR | Status: AC
  Filled 2017-12-28: qty 2

## 2017-12-28 MED ORDER — MEPERIDINE HCL 25 MG/ML IJ SOLN: 6.2500 mg | INTRAMUSCULAR | Status: DC | PRN

## 2017-12-28 MED ORDER — ZOLPIDEM TARTRATE 5 MG PO TABS: 5.0000 mg | ORAL_TABLET | Freq: Every evening | ORAL | Status: DC | PRN

## 2017-12-28 MED ORDER — SIMETHICONE 80 MG PO CHEW
80.0000 mg | CHEWABLE_TABLET | ORAL | Status: DC | PRN
  Administered 2017-12-28: 80 mg via ORAL

## 2017-12-28 MED ORDER — FENTANYL CITRATE (PF) 100 MCG/2ML IJ SOLN
50.0000 ug | Freq: Once | INTRAMUSCULAR | Status: AC
  Administered 2017-12-28: 50 ug via INTRAVENOUS
  Filled 2017-12-28: qty 2

## 2017-12-28 MED ORDER — NALBUPHINE HCL 10 MG/ML IJ SOLN: 5.0000 mg | Freq: Once | INTRAMUSCULAR | Status: AC | PRN

## 2017-12-28 MED ORDER — OXYCODONE HCL 5 MG PO TABS: 10.0000 mg | ORAL_TABLET | Freq: Four times a day (QID) | ORAL | Status: DC | PRN

## 2017-12-28 MED ORDER — LACTATED RINGERS IV SOLN: INTRAVENOUS | Status: DC

## 2017-12-28 SURGICAL SUPPLY — 38 items
APL SKNCLS STERI-STRIP NONHPOA (GAUZE/BANDAGES/DRESSINGS) ×1
BENZOIN TINCTURE PRP APPL 2/3 (GAUZE/BANDAGES/DRESSINGS) ×1 IMPLANT
CHLORAPREP W/TINT 26ML (MISCELLANEOUS) ×2 IMPLANT
CLAMP CORD UMBIL (MISCELLANEOUS) IMPLANT
CLOSURE STERI STRIP 1/2 X4 (GAUZE/BANDAGES/DRESSINGS) ×1 IMPLANT
CLOTH BEACON ORANGE TIMEOUT ST (SAFETY) ×2 IMPLANT
DRSG OPSITE POSTOP 4X10 (GAUZE/BANDAGES/DRESSINGS) ×3 IMPLANT
ELECT REM PT RETURN 9FT ADLT (ELECTROSURGICAL) ×2
ELECTRODE REM PT RTRN 9FT ADLT (ELECTROSURGICAL) ×1 IMPLANT
EXTRACTOR VACUUM M CUP 4 TUBE (SUCTIONS) IMPLANT
GLOVE BIO SURGEON STRL SZ 6.5 (GLOVE) ×2 IMPLANT
GLOVE BIOGEL PI IND STRL 7.0 (GLOVE) ×2 IMPLANT
GLOVE BIOGEL PI INDICATOR 7.0 (GLOVE) ×2
GOWN STRL REUS W/TWL LRG LVL3 (GOWN DISPOSABLE) ×4 IMPLANT
KIT ABG SYR 3ML LUER SLIP (SYRINGE) IMPLANT
NDL HYPO 25X5/8 SAFETYGLIDE (NEEDLE) IMPLANT
NEEDLE HYPO 22GX1.5 SAFETY (NEEDLE) IMPLANT
NEEDLE HYPO 25X5/8 SAFETYGLIDE (NEEDLE) IMPLANT
NS IRRIG 1000ML POUR BTL (IV SOLUTION) ×2 IMPLANT
PACK C SECTION WH (CUSTOM PROCEDURE TRAY) ×2 IMPLANT
PAD ABD 7.5X8 STRL (GAUZE/BANDAGES/DRESSINGS) ×1 IMPLANT
PAD OB MATERNITY 4.3X12.25 (PERSONAL CARE ITEMS) ×2 IMPLANT
PENCIL SMOKE EVAC W/HOLSTER (ELECTROSURGICAL) ×2 IMPLANT
SPONGE DRAIN TRACH 4X4 STRL 2S (GAUZE/BANDAGES/DRESSINGS) ×2 IMPLANT
STRIP CLOSURE SKIN 1/2X4 (GAUZE/BANDAGES/DRESSINGS) IMPLANT
SUT MON AB 4-0 PS1 27 (SUTURE) ×2 IMPLANT
SUT PLAIN 0 NONE (SUTURE) IMPLANT
SUT PLAIN 2 0 XLH (SUTURE) ×1 IMPLANT
SUT VIC AB 0 CT1 36 (SUTURE) ×4 IMPLANT
SUT VIC AB 0 CTX 36 (SUTURE) ×4
SUT VIC AB 0 CTX36XBRD ANBCTRL (SUTURE) ×2 IMPLANT
SUT VIC AB 2-0 CT1 (SUTURE) ×1 IMPLANT
SUT VIC AB 2-0 CT1 27 (SUTURE) ×2
SUT VIC AB 2-0 CT1 TAPERPNT 27 (SUTURE) ×1 IMPLANT
SUT VIC AB 4-0 KS 27 (SUTURE) ×1 IMPLANT
SYR CONTROL 10ML LL (SYRINGE) IMPLANT
TOWEL OR 17X24 6PK STRL BLUE (TOWEL DISPOSABLE) ×2 IMPLANT
TRAY FOLEY BAG SILVER LF 14FR (SET/KITS/TRAYS/PACK) IMPLANT

## 2017-12-28 NOTE — Addendum Note (Signed)
Addendum  created 12/28/17 1707 by Elgie CongoMalinova, Valari Taylor H, CRNA   Sign clinical note

## 2017-12-28 NOTE — Transfer of Care (Signed)
Immediate Anesthesia Transfer of Care Note  Patient: Lori Davis  Procedure(s) Performed: Repeat CESAREAN SECTION (N/A )  Patient Location: PACU  Anesthesia Type:Spinal  Level of Consciousness: awake, alert  and oriented  Airway & Oxygen Therapy: Patient Spontanous Breathing  Post-op Assessment: Report given to RN and Post -op Vital signs reviewed and stable  Post vital signs: Reviewed and stable  Last Vitals:  Vitals:   12/28/17 1015 12/28/17 1030  BP: 105/64 108/68  Pulse: (!) 50 (!) 53  Resp: 16 17  Temp: (!) 36.3 C   SpO2: 100% 100%    Last Pain:  Vitals:   12/28/17 1015  TempSrc: Oral         Complications: No apparent anesthesia complications

## 2017-12-28 NOTE — Brief Op Note (Signed)
12/28/2017  9:47 AM  PATIENT:  Lori Davis  37 y.o. female  PRE-OPERATIVE DIAGNOSIS:  Previous Cesarean Section  POST-OPERATIVE DIAGNOSIS:  Previous Cesarean Section:elective repeat  PROCEDURE:  Procedure(s) with comments: Repeat CESAREAN SECTION (N/A) - EDD: 01/04/18  LTCS, 2 layer closure  SURGEON:  Surgeon(s) and Role:    Noland Fordyce* Aleynah Rocchio, MD - Primary  PHYSICIAN ASSISTANT:   ASSISTANTSRenae Fickle: Paul, CNM   ANESTHESIA:   spinal  EBL:  300 mL   BLOOD ADMINISTERED:none  DRAINS: Urinary Catheter (Foley)   LOCAL MEDICATIONS USED:  NONE  SPECIMEN:  Source of Specimen:  placenta  DISPOSITION OF SPECIMEN:  L&D  COUNTS:  YES  TOURNIQUET:  * No tourniquets in log *  DICTATION: .Note written in EPIC  PLAN OF CARE: Admit to inpatient   PATIENT DISPOSITION:  PACU - hemodynamically stable.   Delay start of Pharmacological VTE agent (>24hrs) due to surgical blood loss or risk of bleeding: yes

## 2017-12-28 NOTE — Progress Notes (Signed)
Nurse attempted to do orthostatic vitals on the patient multiple times. Patient in too much pain to get up. Will attempt to do orthostatic vital signs once patients pain is better controlled.

## 2017-12-28 NOTE — Op Note (Signed)
12/28/2017  9:47 AM  PATIENT:  Lori Davis  37 y.o. female  PRE-OPERATIVE DIAGNOSIS:  Previous Cesarean Section  POST-OPERATIVE DIAGNOSIS:  Previous Cesarean Section:elective repeat  PROCEDURE:  Procedure(s) with comments: Repeat CESAREAN SECTION (N/A) - EDD: 01/04/18  LTCS, 2 layer closure  SURGEON:  Surgeon(s) and Role:    Noland Fordyce* Raaga Maeder, MD - Primary  PHYSICIAN ASSISTANT:   ASSISTANTSRenae Fickle: Paul, CNM   ANESTHESIA:   spinal  EBL:  300 mL   BLOOD ADMINISTERED:none  DRAINS: Urinary Catheter (Foley)   LOCAL MEDICATIONS USED:  NONE  SPECIMEN:  Source of Specimen:  placenta  DISPOSITION OF SPECIMEN:  L&D  COUNTS:  YES  TOURNIQUET:  * No tourniquets in log *  DICTATION: .Note written in EPIC  PLAN OF CARE: Admit to inpatient   PATIENT DISPOSITION:  PACU - hemodynamically stable.   Delay start of Pharmacological VTE agent (>24hrs) due to surgical blood loss or risk of bleeding: yes     Findings:  @BABYSEXEBC @ infant,  APGAR (1 MIN): 9   APGAR (5 MINS): 9   APGAR (10 MINS):   Normal uterus, tubes and ovaries, normal placenta. 3VC, clear amniotic fluid  EBL: per anasthesia cc Antibiotics:   2g Ancef Complications: none  Indications: This is a 37 y.o. year-old, G3P2  At 4164w0d admitted for elective RCS. Risks benefits and alternatives of the procedure were discussed with the patient who agreed to proceed  Procedure:  After informed consent was obtained the patient was taken to the operating room where spinal anesthesia was initiated.  She was prepped and draped in the normal sterile fashion in dorsal supine position with a leftward tilt.  A foley catheter was in place.  A Pfannenstiel skin incision was made 2 cm above the pubic symphysis in the midline with the scalpel.  Dissection was carried down with the Bovie cautery until the fascia was reached. The fascia was incised in the midline. The incision was extended laterally with the Mayo scissors. The inferior  aspect of the fascial incision was grasped with the Coker clamps, elevated up and the underlying rectus muscles were dissected off sharply. The superior aspect of the fascial incision was grasped with the Coker clamps elevated up and the underlying rectus muscles were dissected off sharply.  The peritoneum was entered sharply. The peritoneal incision was extended superiorly and inferiorly with good visualization of the bladder. The bladder ws adhesed high to the anterior uterine wall and this was taken down sharply. The blade was inserted and palpation was done to assess the fetal position and the location of the uterine vessels. The lower segment of the uterus was incised sharply with the scalpel and extended  bluntly in the cephalo-caudal fashion. The infant was grasped, brought to the incision,  rotated and the infant was delivered with fundal pressure. The nose and mouth were bulb suctioned. The cord was clamped and cut after 1 minute delay. The infant was handed off to the waiting pediatrician. The placenta was expressed. The uterus was exteriorized. The uterus was cleared of all clots and debris. The uterine incision was repaired with 0 Vicryl in a running locked fashion.  A second layer of the same suture was used in an imbricating fashion to obtain excellent hemostasis.  The uterus was then returned to the abdomen, the gutters were cleared of all clots and debris. The uterine incision was reinspected and found to be hemostatic. The peritoneum was grasped and closed with 2-0 Vicryl in a running fashion. The  cut muscle edges and the underside of the fascia were inspected and found to be hemostatic. The fascia was closed with 0 Vicryl in 2 halves. The subcutaneous tissue was irrigated. Scarpa's layer was closed with a 2-0 plain gut suture. The skin was closed with a 4-0 vicryl on a keith needle. The patient tolerated the procedure well. Sponge lap and needle counts were correct x3 and patient was taken to the  recovery room in a stable condition.  Lendon Colonel 12/28/2017 9:48 AM

## 2017-12-28 NOTE — Progress Notes (Signed)
Patient ID: Lori Davis, female   DOB: 1981-05-04, 37 y.o.   MRN: [id number]  Called to assess incision since draining bloody. 3rd rpt C/s today, uneventful surgery per Dr Ernestina PennaFogleman. Pressure dressing placed in PACU since honeycomb was bloody, that was removed by PP RN and replaced with new pressure dressing and honeycomb. That was relatively dry but patent called for pain meds, considering <12 hrs from surgery and she had received Duramorph in spinal, I came to evaluate her to r/o hematoma in incision.   Fentanyl 50mcg IV given to allow better evaluation of incision.   BP (!) 91/45 Comment: patient states she is not dizzy  Pulse 68   Temp 98.2 F (36.8 C) (Oral)   Resp 18   Ht 5\' 6"  (1.676 m)   Wt 192 lb (87.1 kg)   LMP 04/03/2017   SpO2 98%   Breastfeeding? Unknown   BMI 30.99 kg/m    Abdo soft, uterus firm, non acute exam. Incision intact, draining serosanguinous fluid with good pressure along the incision superior and inferior to the incision, no hematoma/ collection palpated, pt tolerated exam well.   Pressure dressing placed, defer steristrips and honeycomb now, reassess at intervals to r/o fluid collection under the incision/ hematoma and need to open it.   Pt informed of findings and plan, understands and agrees, Dr Ernestina PennaFogleman informed as well.   V.Faust Thorington, MD

## 2017-12-28 NOTE — Anesthesia Postprocedure Evaluation (Signed)
Anesthesia Post Note  Patient: Lori Davis  Procedure(s) Performed: Repeat CESAREAN SECTION (N/A )     Patient location during evaluation: PACU Anesthesia Type: Spinal Level of consciousness: awake and alert Pain management: pain level controlled Vital Signs Assessment: post-procedure vital signs reviewed and stable Respiratory status: spontaneous breathing and respiratory function stable Cardiovascular status: blood pressure returned to baseline and stable Postop Assessment: spinal receding Anesthetic complications: no    Last Vitals:  Vitals:   12/28/17 1100 12/28/17 1115  BP: 105/69 113/70  Pulse: (!) 53 (!) 56  Resp: 16 19  Temp:  36.7 C  SpO2: 100% 99%    Last Pain:  Vitals:   12/28/17 1115  TempSrc: Axillary  PainSc: 3    Pain Goal:                 Ange Puskas DANIEL

## 2017-12-28 NOTE — Anesthesia Postprocedure Evaluation (Signed)
Anesthesia Post Note  Patient: Lori Davis  Procedure(s) Performed: Repeat CESAREAN SECTION (N/A )     Patient location during evaluation: Mother Baby Anesthesia Type: MAC Level of consciousness: awake and alert Pain management: pain level controlled Vital Signs Assessment: post-procedure vital signs reviewed and stable Respiratory status: spontaneous breathing, nonlabored ventilation and respiratory function stable Cardiovascular status: stable Postop Assessment: no headache, no backache, adequate PO intake, no apparent nausea or vomiting, patient able to bend at knees and spinal receding Anesthetic complications: no    Last Vitals:  Vitals:   12/28/17 1530 12/28/17 1635  BP:    Pulse:    Resp:    Temp:    SpO2: 96% 97%    Last Pain:  Vitals:   12/28/17 1636  TempSrc:   PainSc: 7    Pain Goal:                 Jabier Mutton

## 2017-12-28 NOTE — Anesthesia Procedure Notes (Signed)
Spinal  Patient location during procedure: OR Start time: 12/28/2017 8:36 AM End time: 12/28/2017 8:44 AM Staffing Anesthesiologist: Heather RobertsSinger, Amanii Snethen, MD Performed: anesthesiologist  Preanesthetic Checklist Completed: patient identified, surgical consent, pre-op evaluation, timeout performed, IV checked, risks and benefits discussed and monitors and equipment checked Spinal Block Patient position: sitting Prep: DuraPrep Patient monitoring: cardiac monitor, continuous pulse ox and blood pressure Approach: midline Location: L2-3 Injection technique: single-shot Needle Needle type: Pencan  Needle gauge: 24 G Needle length: 9 cm Additional Notes Functioning IV was confirmed and monitors were applied. Sterile prep and drape, including hand hygiene and sterile gloves were used. The patient was positioned and the spine was prepped. The skin was anesthetized with lidocaine.  Free flow of clear CSF was obtained prior to injecting local anesthetic into the CSF.  The spinal needle aspirated freely following injection.  The needle was carefully withdrawn.  The patient tolerated the procedure well.

## 2017-12-29 ENCOUNTER — Other Ambulatory Visit: Payer: Self-pay

## 2017-12-29 LAB — CBC
HEMATOCRIT: 29.4 % — AB (ref 36.0–46.0)
HEMOGLOBIN: 10.1 g/dL — AB (ref 12.0–15.0)
MCH: 30.5 pg (ref 26.0–34.0)
MCHC: 34.4 g/dL (ref 30.0–36.0)
MCV: 88.8 fL (ref 78.0–100.0)
Platelets: 215 10*3/uL (ref 150–400)
RBC: 3.31 MIL/uL — AB (ref 3.87–5.11)
RDW: 13.3 % (ref 11.5–15.5)
WBC: 9.8 10*3/uL (ref 4.0–10.5)

## 2017-12-29 LAB — BIRTH TISSUE RECOVERY COLLECTION (PLACENTA DONATION)

## 2017-12-29 NOTE — Lactation Note (Signed)
This note was copied from a baby's chart. Lactation Consultation Note  Patient Name: Lori Davis ZOXWR'UToday's Date: 12/29/2017 Reason for consult: Initial assessment  4433 hour old FT female who is being mostly formula fed by his mother. Mom tried BF but she's in too much pain to latch baby on. When baby latched on, mom asked to break the latch, nipples are sore, with positional stripes on both nipples, and also small cracks on tip of nipples. Mom has a history of low milk supply with previous child, she only BF for one month. She has her mind set up on formula though, [facility] and she signed up for full formula package. Reviewed engorgement and also the benefits of BF and risk of formula feeding. Encourage her to put baby on the breast once her nipples heal, and in the meantime to pump at least with a manual pump to prevent engorgement since she's not putting baby on the breast. Discuss treatment of engorgement. Mom is aware of LC services and will contact us if needed.  Maternal Data Formula Feeding for Exclusion: No Has patient been taught Hand Expression?: Yes Does the patient have breastfeeding experience prior to this delivery?: Yes  Feeding Feeding Type: Breast Fed Nipple Type: Slow - flow Length of feed: 0 min  LATCH Score Latch: Repeated attempts needed to sustain latch, nipple held in mouth throughout feeding, stimulation needed to elicit sucking reflex.  Audible Swallowing: None  Type of Nipple: Everted at rest and after stimulation  Comfort (Breast/Nipple): Filling, red/small blisters or bruises, mild/mod discomfort  Hold (Positioning): Full assist, staff holds infant at breast  LATCH Score: 4  Interventions Interventions: Breast feeding basics reviewed;Assisted with latch;Skin to skin;Breast massage;Hand express;Breast compression;Adjust position;Support pillows;Position options;Expressed milk;Coconut oil;Comfort gels;Hand pump  Lactation Tools  Discussed/Used WIC Program: Yes Pump Review: Milk Storage Initiated by:: MPeck, IBCLC Date initiated:: 12/29/17   Consult Status Consult Status: PRN Date: 12/30/17 Follow-up type: In-patient    Aadith Raudenbush Venetia ConstableS Yitta Gongaware 12/29/2017, 6:20 PM

## 2017-12-29 NOTE — Progress Notes (Signed)
POD# 1 s/p RCS  S: Pt notes pain controlled w/ po meds,  Mostly incisional pain, more at angles. Minimal lochia but has been having oozing from incision. No further bleeding/ oozing of dressing changes needed after Dr. Juliene PinaMody expressed blood/ fluid from incision last night. No void yet, 5 hrs from foley remval. Out of bed w/o dizziness or chest pain, tol reg po, no flatus yet. Pt is  Breastfeeding though baby with poor latch  Vitals:   12/28/17 2137 12/28/17 2356 12/29/17 0214 12/29/17 0403  BP: 92/60 (!) 91/53  (!) 101/59  Pulse: 67 (!) 53  (!) 49  Resp:  16  16  Temp:  98.4 F (36.9 C)  97.6 F (36.4 C)  TempSrc:  Oral  Oral  SpO2:  97% 94%   Weight:      Height:        Gen: well appearing CV: RRR Pulm: CTAB Abd: soft, ND, approp tender, fundus below umbilicus, NT Inc: pressure dressing in place, no staining.  LE: tr edema, NT  CBC    Component Value Date/Time   WBC 9.8 12/29/2017 0503   RBC 3.31 (L) 12/29/2017 0503   HGB 10.1 (L) 12/29/2017 0503   HCT 29.4 (L) 12/29/2017 0503   PLT 215 12/29/2017 0503   MCV 88.8 12/29/2017 0503   MCH 30.5 12/29/2017 0503   MCHC 34.4 12/29/2017 0503   RDW 13.3 12/29/2017 0503    A/P: POD#  1 s/p RCS - post-op. Doing well. Awaiting return of bowel and bladder function. D/w ppt and nurse that pt needs to void by 6 hrs after foley removal. Plan made. - Incisional bleeding, stable at this time.    Lendon ColonelKelly A Ann-Marie Kluge 12/29/2017 9:04 AM

## 2017-12-30 NOTE — Progress Notes (Signed)
POD# 2 s/p RCS  S: Pt notes pain controlled w/ po meds,  Mostly incisional pain, more at angles. Minimal lochia but has been having oozing from incision. No further bleeding/ oozing of dressing changes needed after Dr. Juliene PinaMody expressed blood/ fluid from incision last night. No void yet, 5 hrs from foley remval. Out of bed w/o dizziness or chest pain, tol reg po, no flatus yet. Pt is  Breastfeeding though baby with poor latch  Vitals:   12/29/17 0214 12/29/17 0403 12/29/17 1828 12/30/17 0520  BP:  (!) 101/59 (!) 95/59 95/67  Pulse:  (!) 49 60 (!) 53  Resp:  16 18 18   Temp:  97.6 F (36.4 C) 98.2 F (36.8 C) 97.6 F (36.4 C)  TempSrc:  Oral Oral Axillary  SpO2: 94%  99%   Weight:      Height:        Gen: well appearing CV: RRR Pulm: CTAB Abd: soft, ND, approp tender, fundus below umbilicus, NT Inc: pressure dressing in place, no staining.  LE: tr edema, NT  CBC    Component Value Date/Time   WBC 9.8 12/29/2017 0503   RBC 3.31 (L) 12/29/2017 0503   HGB 10.1 (L) 12/29/2017 0503   HCT 29.4 (L) 12/29/2017 0503   PLT 215 12/29/2017 0503   MCV 88.8 12/29/2017 0503   MCH 30.5 12/29/2017 0503   MCHC 34.4 12/29/2017 0503   RDW 13.3 12/29/2017 0503    A/P: POD#  2 s/p RCS - post-op. Doing well. Awaiting return of bowel and bladder function. D/w ppt and nurse that pt needs to void by 6 hrs after foley removal. Plan made. - Incisional bleeding, stable at this time.  Shower today  Lenoard AdenAAVON,Shama Monfils J 12/30/2017 8:54 AM

## 2017-12-31 MED ORDER — ACETAMINOPHEN 325 MG PO TABS: 650.0000 mg | ORAL_TABLET | ORAL | Status: AC | PRN

## 2017-12-31 MED ORDER — COCONUT OIL OIL: 1.0000 "application " | TOPICAL_OIL | 0 refills | Status: AC | PRN

## 2017-12-31 MED ORDER — SIMETHICONE 80 MG PO CHEW: 80.0000 mg | CHEWABLE_TABLET | Freq: Three times a day (TID) | ORAL | 0 refills | Status: AC

## 2017-12-31 MED ORDER — SENNOSIDES-DOCUSATE SODIUM 8.6-50 MG PO TABS: 2.0000 | ORAL_TABLET | ORAL | Status: AC

## 2017-12-31 MED ORDER — OXYCODONE HCL 10 MG PO TABS: 10.0000 mg | ORAL_TABLET | Freq: Four times a day (QID) | ORAL | 0 refills | Status: AC | PRN

## 2017-12-31 MED ORDER — PRENATAL MULTIVITAMIN CH: 1.0000 | ORAL_TABLET | Freq: Every day | ORAL | Status: AC

## 2017-12-31 MED ORDER — IBUPROFEN 600 MG PO TABS: 600.0000 mg | ORAL_TABLET | Freq: Four times a day (QID) | ORAL | 0 refills | Status: AC

## 2017-12-31 NOTE — Discharge Summary (Signed)
OB Discharge Summary     Patient Name: Neville Pauls DOB: 04/09/1981 MRN: [id number]  Date of admission: 12/28/2017 Delivering MD: Noland Fordyce   Date of discharge: 12/31/2017  Admitting diagnosis: Previous Cesarean Section Intrauterine pregnancy: [redacted]w[redacted]d     Secondary diagnosis:  Principal Problem:   Cesarean delivery delivered - elective repeat - 1/16 Active Problems:   Postpartum care following cesarean delivery (1/16)     Discharge diagnosis: Term Pregnancy Delivered                                                                                                Post partum procedures:none  Complications: None  Hospital course:  Sceduled C/S   37 y.o. yo G3P2003 at [redacted]w[redacted]d was admitted to the hospital 12/28/2017 for scheduled cesarean section with the following indication:Elective Repeat.  Membrane Rupture Time/Date: 9:12 AM ,12/28/2017   Patient delivered a Viable infant.12/28/2017  Details of operation can be found in separate operative note.  Pateint had an uncomplicated postpartum course.  She is ambulating, tolerating a regular diet, passing flatus, and urinating well. Patient is discharged home in stable condition on  12/31/17         Physical exam  Vitals:   12/29/17 1828 12/30/17 0520 12/30/17 1731 12/31/17 0606  BP: (!) 95/59 95/67 108/67 103/63  Pulse: 60 (!) 53 (!) 59 (!) 58  Resp: 18 18 18 18   Temp: 98.2 F (36.8 C) 97.6 F (36.4 C) 98.1 F (36.7 C) (!) 97.3 F (36.3 C)  TempSrc: Oral Axillary Oral Axillary  SpO2: 99%  98%   Weight:      Height:       General: alert, cooperative and no distress Lochia: appropriate Uterine Fundus: firm Incision: Healing well with no significant drainage, Dressing is clean, dry, and intact DVT Evaluation: No cords or calf tenderness. No significant calf/ankle edema. Labs: Lab Results  Component Value Date   WBC 9.8 12/29/2017   HGB 10.1 (L) 12/29/2017   HCT 29.4 (L) 12/29/2017   MCV 88.8 12/29/2017   PLT 215  12/29/2017   No flowsheet data found.  Discharge instruction: per After Visit Summary and "Baby and Me Booklet".  After visit meds:  Allergies as of 12/31/2017   No Known Allergies     Medication List    TAKE these medications   acetaminophen 325 MG tablet Commonly known as:  TYLENOL Take 2 tablets (650 mg total) by mouth every 4 (four) hours as needed (for pain scale < 4).   coconut oil Oil Apply 1 application topically as needed.   ibuprofen 600 MG tablet Commonly known as:  ADVIL,MOTRIN Take 1 tablet (600 mg total) by mouth every 6 (six) hours.   MULTIVITAMIN PO Take 1 tablet by mouth daily.   Oxycodone HCl 10 MG Tabs Take 1 tablet (10 mg total) by mouth every 6 (six) hours as needed for severe pain.   prenatal multivitamin Tabs tablet Take 1 tablet by mouth daily at 12 noon. Start taking on:  01/01/2018   senna-docusate 8.6-50 MG tablet Commonly known as:  Senokot-S Take 2 tablets by  mouth daily. Start taking on:  01/01/2018   simethicone 80 MG chewable tablet Commonly known as:  MYLICON Chew 1 tablet (80 mg total) by mouth 3 (three) times daily after meals.       Diet: routine diet  Activity: Advance as tolerated. Pelvic rest for 6 weeks.   Outpatient follow up:6 weeks  Postpartum contraception: Not Discussed  Newborn Data: Live born female  Birth Weight: 7 lb 11.3 oz (3495 g) APGAR: 9, 9  Newborn Delivery   Birth date/time:  12/28/2017 09:13:00 Delivery type:  C-Section, Low Transverse C-section categorization:  Repeat     Baby Feeding: Bottle and Breast Disposition:home with mother   12/31/2017 Neta Mendsaniela C Dyrell Tuccillo, CNM

## 2017-12-31 NOTE — Progress Notes (Signed)
Discharge instructions given to pt. Instructed pt on incisional care, how often to pump, and when to contact the provider. Pt has no questions at this time. Pt walking out with s/o and baby in stable condition.

## 2017-12-31 NOTE — Progress Notes (Signed)
Upon assessment, RN noted filling/engorgement of patient's breast.  Patient is unsure what she would like to do.  At first she stated she wanted to know what to do to stop the milk.  After discussion of decreased stimulation, wearing a bra, cabbage leaves, no heat/use ice etc, she stated that if there is milk in her breasts she might like to pump now.  She stated she had a very hard time with her first two babies and never had a lot of milk.  RN updated lactation and requested that she be seen by them to help her form a plan.  Will continue to monitor.

## 2017-12-31 NOTE — Lactation Note (Signed)
This note was copied from a baby's chart. Lactation Consultation Note  Patient Name: Lori Davis ZOXWR'UToday's Date: 12/31/2017  Breasts are full this AM.  Mom considering putting baby back to breast or pumping.  Unsure of decision. We discussed options and treatment of engorgement.  Mom is leaning towards pumping but would like to think about it.  Offered assist and support with her decision.  She will let her nurse know what she decides.   Maternal Data    Feeding    LATCH Score                   Interventions    Lactation Tools Discussed/Used     Consult Status      Huston FoleyMOULDEN, Pavlos Yon S 12/31/2017, 11:30 AM

## 2020-04-18 LAB — BASIC METABOLIC PANEL
BUN: 12 (ref 4–21)
Creatinine: 0.6 (ref 0.5–1.1)

## 2020-04-18 LAB — TSH: TSH: 7.23 — AB (ref 0.41–5.90)

## 2020-06-04 LAB — THYROID PEROXIDASE ANTIBODY: Thyroid Peroxidase Ab: 429

## 2020-06-04 LAB — T3: Free T3: 3.3

## 2020-06-04 LAB — T4, FREE: Free T4: 0.9

## 2020-06-06 ENCOUNTER — Ambulatory Visit: Payer: BLUE CROSS/BLUE SHIELD | Admitting: Endocrinology
